# Patient Record
Sex: Male | Born: 1995 | Race: White | Hispanic: No | Marital: Single | Smoking: Never smoker
Health system: Southern US, Community
[De-identification: ages and names within clinical notes are randomized; demographics above are authoritative.]

## PROBLEM LIST (undated history)

## (undated) DIAGNOSIS — F988 Other specified behavioral and emotional disorders with onset usually occurring in childhood and adolescence: Secondary | ICD-10-CM

---
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
## 2018-10-30 ENCOUNTER — Encounter (HOSPITAL_COMMUNITY): Payer: Self-pay

## 2018-10-30 ENCOUNTER — Other Ambulatory Visit: Payer: Self-pay

## 2018-10-30 ENCOUNTER — Emergency Department (HOSPITAL_COMMUNITY)
Admission: EM | Admit: 2018-10-30 | Discharge: 2018-10-30 | Disposition: A | Discharge status: Home / Self Care | Payer: 59 | Attending: Emergency Medicine | Admitting: Emergency Medicine

## 2018-10-30 DIAGNOSIS — B2790 Infectious mononucleosis, unspecified without complication: Secondary | ICD-10-CM | POA: Diagnosis not present

## 2018-10-30 DIAGNOSIS — J029 Acute pharyngitis, unspecified: Secondary | ICD-10-CM | POA: Diagnosis present

## 2018-10-30 LAB — GROUP A STREP BY PCR: GROUP A STREP BY PCR: NOT DETECTED

## 2018-10-30 LAB — MONONUCLEOSIS SCREEN: Mono Screen: POSITIVE — AB

## 2018-10-30 MED ORDER — PREDNISONE 50 MG PO TABS: 50.0000 mg | ORAL_TABLET | Freq: Every day | ORAL | 0 refills | Status: AC

## 2018-10-30 MED ORDER — PREDNISONE 50 MG PO TABS
50.0000 mg | ORAL_TABLET | Freq: Once | ORAL | Status: AC
  Administered 2018-10-30: 50 mg via ORAL
  Filled 2018-10-30: qty 1

## 2018-10-30 NOTE — ED Triage Notes (Signed)
Patient is AOx4 and ambulatory. Patient began to have swollen bilateral tonsils 2-3 days ago. Tonsils have increased in size. Patient was seen at urgent care and patient was informed to come to ED due to increasing size of tonsils. Only pain noted is pain when patient attempts to swallow. No issues breathing.

## 2018-10-30 NOTE — Discharge Instructions (Addendum)
Take ibuprofen or tylenol  as needed for the sore throat.  Drink plenty of fluids.  Avoid any contact sports.  Take the steroids to help with the inflammation.  You can also try taking over-the-counter medication such as Cepacol to help with your sore throat.

## 2018-10-30 NOTE — ED Provider Notes (Signed)
[facility]-EMERGENCY DEPT Provider Note   CSN: [id number] Arrival date & time: 10/30/18  1715     History   Chief Complaint Chief Complaint  Patient presents with  . Lymphadenopathy    HPI [name] Norman is a 23 y.o. male.  HPI Patient presents to the emergency room for evaluation of a sore throat.  Patient states his symptoms started a few days ago.  He has had some congestion but mostly has had soreness and swelling in his throat.  It hurts for him to swallow.  [facility] and [facility] suggested he come to the emergency room.  Patient denies any chest pain or shortness of breath.  No abdominal pain.  No vomiting or diarrhea.  He is able to drink liquids. Past Medical History:  Diagnosis Date  . ADD (attention deficit disorder)     Patient Active Problem List   Diagnosis Date Noted  . Kidney stone on right side 12/05/2013    History reviewed. No pertinent surgical history.      Home Medications    Prior to Admission medications   Medication Sig Start Date End Date Taking? Authorizing Provider  escitalopram (LEXAPRO) 5 MG tablet Take 5 mg by mouth daily. Starting 10/26/18 1 tablet po x 14 days, then 2 ts po x 14 days 10/26/18  Yes [provider]  ibuprofen (ADVIL,MOTRIN) 200 MG tablet Take 600 mg by mouth every 6 (six) hours as needed for moderate pain.   Yes [provider]  lisdexamfetamine (VYVANSE) 40 MG capsule Take 40 mg by mouth as needed (school).   Yes [provider]  sulfamethoxazole-trimethoprim (BACTRIM DS,SEPTRA DS) 800-160 MG tablet Take 1 tablet by mouth 2 (two) times daily. Day 4 of 7. Dose 1 of 2 on 10/30/18 10/26/18  Yes [provider]  oxyCODONE-acetaminophen (ROXICET) 5-325 MG per tablet Take 1-2 tablets by mouth every 4 (four) hours as needed for severe pain. Patient not taking: Reported on 10/30/2018 12/06/13   Sherrie George, PA-C  predniSONE (DELTASONE) 50 MG tablet Take 1  tablet (50 mg total) by mouth daily. 10/30/18   Linwood Dibbles, MD  tamsulosin (FLOMAX) 0.4 MG CAPS capsule Take 1 capsule (0.4 mg total) by mouth at bedtime. Patient not taking: Reported on 10/30/2018 12/06/13   Sherrie George, PA-C    Family History No family history on file.  Social History Social History   Tobacco Use  . Smoking status: Never Smoker  . Smokeless tobacco: Never Used  Substance Use Topics  . Alcohol use: No  . Drug use: No     Allergies   Penicillins   Review of Systems Review of Systems  All other systems reviewed and are negative.    Physical Exam Updated Vital Signs BP 126/83 (BP Location: Left Arm)   Pulse (!) 121   Temp 97.6 F (36.4 C) (Oral)   Resp 19   Ht 1.803 m (5\' 11" )   Wt 98.5 kg   SpO2 97%   BMI 30.27 kg/m   Physical Exam Vitals signs and nursing note reviewed.  Constitutional:      General: He is not in acute distress.    Appearance: He is well-developed.  HENT:     Head: Normocephalic and atraumatic.     Right Ear: External ear normal.     Left Ear: External ear normal.     Mouth/Throat:     Pharynx: Pharyngeal swelling, posterior oropharyngeal erythema and uvula swelling present. No  oropharyngeal exudate.     Tonsils: Tonsillar exudate present. No tonsillar abscesses.  Eyes:     General: No scleral icterus.       Right eye: No discharge.        Left eye: No discharge.     Conjunctiva/sclera: Conjunctivae normal.  Neck:     Musculoskeletal: Neck supple.     Trachea: No tracheal deviation.  Cardiovascular:     Rate and Rhythm: Normal rate.  Pulmonary:     Effort: Pulmonary effort is normal. No respiratory distress.     Breath sounds: No stridor.  Abdominal:     General: There is no distension.  Musculoskeletal:        General: No swelling or deformity.  Lymphadenopathy:     Cervical: Cervical adenopathy present.  Skin:    General: Skin is warm and dry.     Findings: No rash.  Neurological:     Mental Status:  He is alert.     Cranial Nerves: Cranial nerve deficit: no gross deficits.      ED Treatments / Results  Labs (all labs ordered are listed, but only abnormal results are displayed) Labs Reviewed  MONONUCLEOSIS SCREEN - Abnormal; Notable for the following components:      Result Value   Mono Screen POSITIVE (*)    All other components within normal limits  GROUP A STREP BY PCR     Procedures Procedures (including critical care time)  Medications Ordered in ED Medications  predniSONE (DELTASONE) tablet 50 mg (50 mg Oral Given 10/30/18 1846)     Initial Impression / Assessment and Plan / ED Course  I have reviewed the triage vital signs and the nursing notes.  Pertinent labs & imaging results that were available during my care of the patient were reviewed by me and considered in my medical decision making (see chart for details).   Patient's ED work-up is notable for a positive mono screen.  Negative for strep throat.  Patient does have significant tonsillar edema and erythema but no evidence to suggest abscess and is breathing without difficulty.  Plan on discharge home with a short course of steroids to help with inflammation.  Discussed over-the-counter medications.  Also discussed complications associated with mononucleosis.  Recommend avoiding any contact sports till his illness has resolved.  Final Clinical Impressions(s) / ED Diagnoses   Final diagnoses:  Acute pharyngitis due to infectious mononucleosis    ED Discharge Orders         Ordered    predniSONE (DELTASONE) 50 MG tablet  Daily     10/30/18 1912           Linwood Dibbles, MD 10/30/18 1914
